# Patient Record
Sex: Female | Born: 1975 | Race: Black or African American | Hispanic: No | Marital: Single | Smoking: Former smoker
Health system: Southern US, Community
[De-identification: ages and names within clinical notes are randomized; demographics above are authoritative.]

## PROBLEM LIST (undated history)

## (undated) DIAGNOSIS — I10 Essential (primary) hypertension: Secondary | ICD-10-CM

---

## 2016-05-15 ENCOUNTER — Encounter: Payer: Self-pay | Admitting: Obstetrics and Gynecology

## 2018-01-22 ENCOUNTER — Emergency Department (HOSPITAL_COMMUNITY): Payer: Self-pay

## 2018-01-22 ENCOUNTER — Emergency Department (HOSPITAL_COMMUNITY)
Admission: EM | Admit: 2018-01-22 | Discharge: 2018-01-22 | Disposition: A | Discharge status: Home / Self Care | Payer: Self-pay | Attending: Emergency Medicine | Admitting: Emergency Medicine

## 2018-01-22 ENCOUNTER — Encounter (HOSPITAL_COMMUNITY): Payer: Self-pay | Admitting: Emergency Medicine

## 2018-01-22 DIAGNOSIS — R1032 Left lower quadrant pain: Secondary | ICD-10-CM | POA: Insufficient documentation

## 2018-01-22 DIAGNOSIS — F17210 Nicotine dependence, cigarettes, uncomplicated: Secondary | ICD-10-CM | POA: Insufficient documentation

## 2018-01-22 DIAGNOSIS — W19XXXA Unspecified fall, initial encounter: Secondary | ICD-10-CM | POA: Insufficient documentation

## 2018-01-22 DIAGNOSIS — M25562 Pain in left knee: Secondary | ICD-10-CM | POA: Insufficient documentation

## 2018-01-22 DIAGNOSIS — M545 Low back pain: Secondary | ICD-10-CM | POA: Insufficient documentation

## 2018-01-22 LAB — POC URINE PREG, ED: PREG TEST UR: NEGATIVE

## 2018-01-22 MED ORDER — METHOCARBAMOL 500 MG PO TABS: 500.0000 mg | ORAL_TABLET | Freq: Three times a day (TID) | ORAL | 0 refills | Status: AC | PRN

## 2018-01-22 MED ORDER — NAPROXEN 500 MG PO TABS: 500.0000 mg | ORAL_TABLET | Freq: Two times a day (BID) | ORAL | 0 refills | Status: AC

## 2018-01-22 NOTE — ED Provider Notes (Signed)
[facility]-EMERGENCY DEPT Provider Note   CSN: [id number] Arrival date & time: 01/22/18  0746     History   Chief Complaint Chief Complaint  Patient presents with  . Fall  . Knee Pain  . Back Pain  . Groin Pain    HPI Jeanette Ortiz is a 42 y.o. female with a hx of tobacco abuse who presents to the ED s/p mechanical fall yesterday afternoon with complaints of pain in the lower back, L groin, and L knee. Patient states she was ambulating on tile floor when she slipped on area of water on the ground. She states that she fell forward onto the L anterior knee and subsequently fell backwards with knee remaining in flexed position resulting in straining of L groin. She states she did hit the back of her head, but had no LOC. Able to get up and ambulate without assistance. She did have mild headache yesterday that resolved, not currently present. She has discomfort in the lower back, L groin, and L knee. Pain is a 5/10 in severity, worse with movement, mildly alleviated by motrin yesterday. Denies current headache, nausea, vomiting, confusion, seizure activity, neck pain, numbness, weakness, or paresthesias.   HPI  History reviewed. No pertinent past medical history.  There are no active problems to display for this patient.   History reviewed. No pertinent surgical history.   OB History   None      Home Medications    Prior to Admission medications   Not on File    Family History No family history on file.  Social History Social History   Tobacco Use  . Smoking status: Current Every Day Smoker    Types: Cigarettes  . Smokeless tobacco: Never Used  Substance Use Topics  . Alcohol use: Yes  . Drug use: Not on file     Allergies   Patient has no known allergies.   Review of Systems Review of Systems  Musculoskeletal: Positive for arthralgias (L knee/groin) and back pain. Negative for neck pain.  Neurological: Positive for headaches  (yesterday, resolved). Negative for dizziness, seizures, syncope, speech difficulty, weakness and numbness.  Psychiatric/Behavioral: Negative for confusion.     Physical Exam Updated Vital Signs BP (!) 159/102 (BP Location: Left Arm)   Pulse 85   Temp 98 F (36.7 C) (Oral)   Resp 18   Ht 5\' 6"  (1.676 m)   Wt 93 kg   LMP 01/12/2018   SpO2 100%   BMI 33.09 kg/m   Physical Exam  Constitutional: She appears well-developed and well-nourished.  Non-toxic appearance. No distress.  HENT:  Head: Normocephalic and atraumatic. Head is without raccoon's eyes and without Battle's sign.  Right Ear: No hemotympanum.  Left Ear: No hemotympanum.  Nose: No rhinorrhea.  Mouth/Throat: Uvula is midline and oropharynx is clear and moist.  Eyes: Pupils are equal, round, and reactive to light. Conjunctivae and EOM are normal. Right eye exhibits no discharge. Left eye exhibits no discharge.  Neck: Normal range of motion. No spinous process tenderness present. No edema and no erythema present.  Cardiovascular: Normal rate and regular rhythm.  No murmur heard. Pulses:      Dorsalis pedis pulses are 2+ on the right side, and 2+ on the left side.  Pulmonary/Chest: Effort normal and breath sounds normal. No respiratory distress. She has no wheezes. She has no rhonchi. She has no rales.  Abdominal: Soft. She exhibits no distension. There is no tenderness.  Musculoskeletal:  No obvious  deformity, appreciable swelling, erythema, ecchymosis, or open wounds.\ Upper extremities: Normal active range of motion of all joints.  Nontender to palpation. Back: Patient has diffuse lower lumbar tenderness without point/focal vertebral tenderness.  No step-off.  No tenderness to the cervical or thoracic spine. Lower extremities: Patient has full active range of motion to bilateral hips, knees, and ankles.  She has some discomfort with movement of the left hip and left knee.  She is tender to palpation along the anterior  left hip as well as the left lower extremity adductor muscle group.  Left knee is tender anteriorly including the patella, patella tendon, and tibial tuberosity.  Lower extremities are otherwise nontender.  Neurovascularly intact distally.   Skin: Skin is warm and dry. No rash noted.  Psychiatric: She has a normal mood and affect. Her behavior is normal.  Nursing note and vitals reviewed.    ED Treatments / Results  Labs (all labs ordered are listed, but only abnormal results are displayed) Labs Reviewed  POC URINE PREG, ED    EKG None  Radiology Dg Lumbar Spine Complete  Result Date: 01/22/2018 CLINICAL DATA:  Low back pain after fall yesterday. EXAM: LUMBAR SPINE - COMPLETE 4+ VIEW COMPARISON:  None. FINDINGS: There is no evidence of lumbar spine fracture. Alignment is normal. Intervertebral disc spaces are maintained. IMPRESSION: Normal lumbar spine. Electronically Signed   By: Lupita Raider, M.D.   On: 01/22/2018 09:22   Dg Knee Complete 4 Views Left  Result Date: 01/22/2018 CLINICAL DATA:  Left knee pain after fall yesterday. EXAM: LEFT KNEE - COMPLETE 4+ VIEW COMPARISON:  None. FINDINGS: No evidence of fracture, dislocation, or joint effusion. No evidence of arthropathy or other focal bone abnormality. Soft tissues are unremarkable. IMPRESSION: Normal left knee. Electronically Signed   By: Lupita Raider, M.D.   On: 01/22/2018 09:24   Dg Hip Unilat With Pelvis 2-3 Views Left  Result Date: 01/22/2018 CLINICAL DATA:  Left hip pain after fall yesterday. EXAM: DG HIP (WITH OR WITHOUT PELVIS) 2-3V LEFT COMPARISON:  None. FINDINGS: There is no evidence of hip fracture or dislocation. There is no evidence of arthropathy or other focal bone abnormality. IMPRESSION: Normal left hip. Electronically Signed   By: Lupita Raider, M.D.   On: 01/22/2018 09:23    Procedures Procedures (including critical care time)  Medications Ordered in ED Medications - No data to display   Initial  Impression / Assessment and Plan / ED Course  I have reviewed the triage vital signs and the nursing notes.  Pertinent labs & imaging results that were available during my care of the patient were reviewed by me and considered in my medical decision making (see chart for details).   Patient presents to the emergency department status post mechanical fall yesterday afternoon with complaints of L groin/knee pain and lower back pain.  Patient nontoxic-appearing, no apparent distress, vitals WNL with the exception of elevated blood pressure, doubt HTN emergency.  Per [place] head CT/cervical spine rules do not feel that further imaging is necessary in these locations.  X-rays obtained in areas of discomfort-negative for fracture or dislocation.  Patient without signs of serious head, neck, or back injury.  No point/focal vertebral tenderness.  She is neurovascularly intact distal to all areas of discomfort. Suspect muscle related soreness. Will discharge patient home with prescriptions for naproxen and robaxin, patient instructed not to drive/operate heavy machinery when taking robaxin. I discussed results, treatment plan, need for PCP follow-up, and  return precautions with the patient. Provided opportunity for questions, patient confirmed understanding and is in agreement with plan.    Final Clinical Impressions(s) / ED Diagnoses   Final diagnoses:  Fall, initial encounter    ED Discharge Orders         Ordered    naproxen (NAPROSYN) 500 MG tablet  2 times daily     01/22/18 0941    methocarbamol (ROBAXIN) 500 MG tablet  Every 8 hours PRN     01/22/18 0941           Anginette Espejo, Pleas Koch, PA-C 01/22/18 0957    Terrilee Files, MD 01/22/18 [phone]

## 2018-01-22 NOTE — Discharge Instructions (Addendum)
Please read and follow all provided instructions.  You have been seen today for pain to the left groin, knee, and lower back.  Tests performed today include: Xrays of affected areas do NOT show any broken bones or dislocations.  Vital signs. See below for your results today.   Home care:  Please apply ice wrapped in a towel 20 minutes on 40 minutes of to areas of discomfort for the next 24/48 hours.   Medications:  - Naproxen is a nonsteroidal anti-inflammatory medication that will help with pain and swelling. Be sure to take this medication as prescribed with food, 1 pill every 12 hours,  It should be taken with food, as it can cause stomach upset, and more seriously, stomach bleeding. Do not take other nonsteroidal anti-inflammatory medications with this such as Advil, Motrin, Aleve, Mobic, Goodie Powder, or Motrin.    - Robaxin is the muscle relaxer I have prescribed, this is meant to help with muscle tightness. Be aware that this medication may make you drowsy therefore the first time you take this it should be at a time you are in an environment where you can rest. Do not drive or operate heavy machinery when taking this medication. Do not drink alcohol or take other sedating medications with this medicine such as narcotics or benzodiazepines.   You make take Tylenol per over the counter dosing with these medications.   We have prescribed you new medication(s) today. Discuss the medications prescribed today with your pharmacist as they can have adverse effects and interactions with your other medicines including over the counter and prescribed medications. Seek medical evaluation if you start to experience new or abnormal symptoms after taking one of these medicines, seek care immediately if you start to experience difficulty breathing, feeling of your throat closing, facial swelling, or rash as these could be indications of a more serious allergic reaction   Follow-up instructions: Please  follow-up with your primary care provider if you continue to have significant pain in 1 week. In this case you may have a more severe injury that requires further care.   Return instructions:  Please return if your digits or extremity are numb or tingling, appear gray or blue, or you have severe pain  Please return if you experience vomiting, dizziness, loss of consciousness.  Please return to the Emergency Department if you experience worsening symptoms.  Please return if you have any other emergent concerns. Additional Information:  Your vital signs today were: BP (!) 159/102 (BP Location: Left Arm)    Pulse 85    Temp 98 F (36.7 C) (Oral)    Resp 18    Ht 5\' 6"  (1.676 m)    Wt 93 kg    LMP 01/12/2018    SpO2 100%    BMI 33.09 kg/m  If your blood pressure (BP) was elevated above 135/85 this visit, please have this repeated by your doctor within one month. ---------------

## 2018-01-22 NOTE — ED Triage Notes (Signed)
Pt reports slipping in water yesterday falling. C/o left knee, lower back pain and groin pain. reports hitting her head but denies LOC or taking blood thinners..Marland Kitchen

## 2019-09-08 IMAGING — CR DG KNEE COMPLETE 4+V*L*
4 series · 4 of 4 positions shown · non-contrast
Comparison: None.

CLINICAL DATA: Left knee pain after fall yesterday.

EXAM:
LEFT KNEE - COMPLETE 4+ VIEW

[t knee ap left]
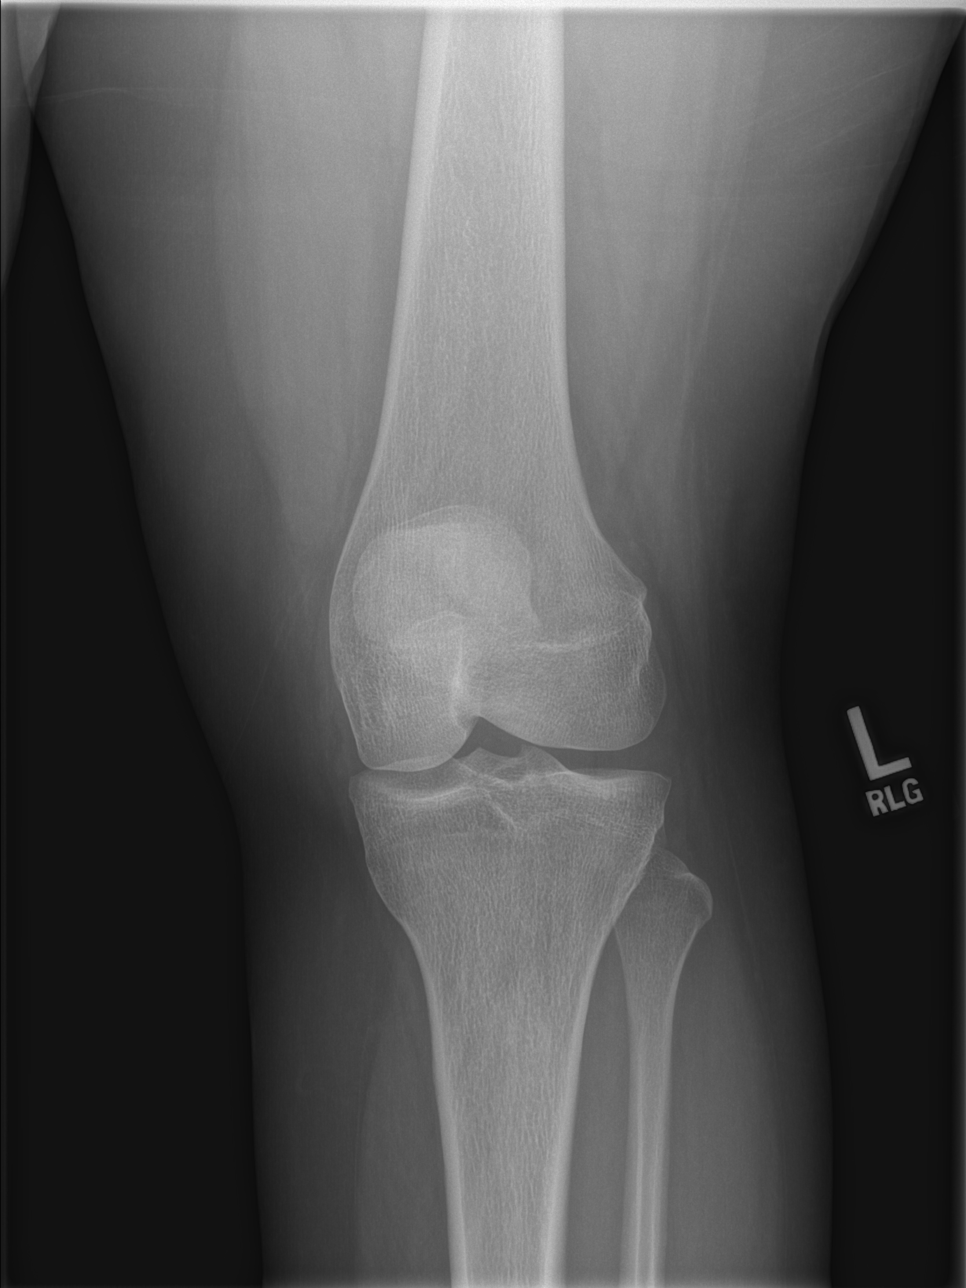

[t knee obl left (1 of 2)]
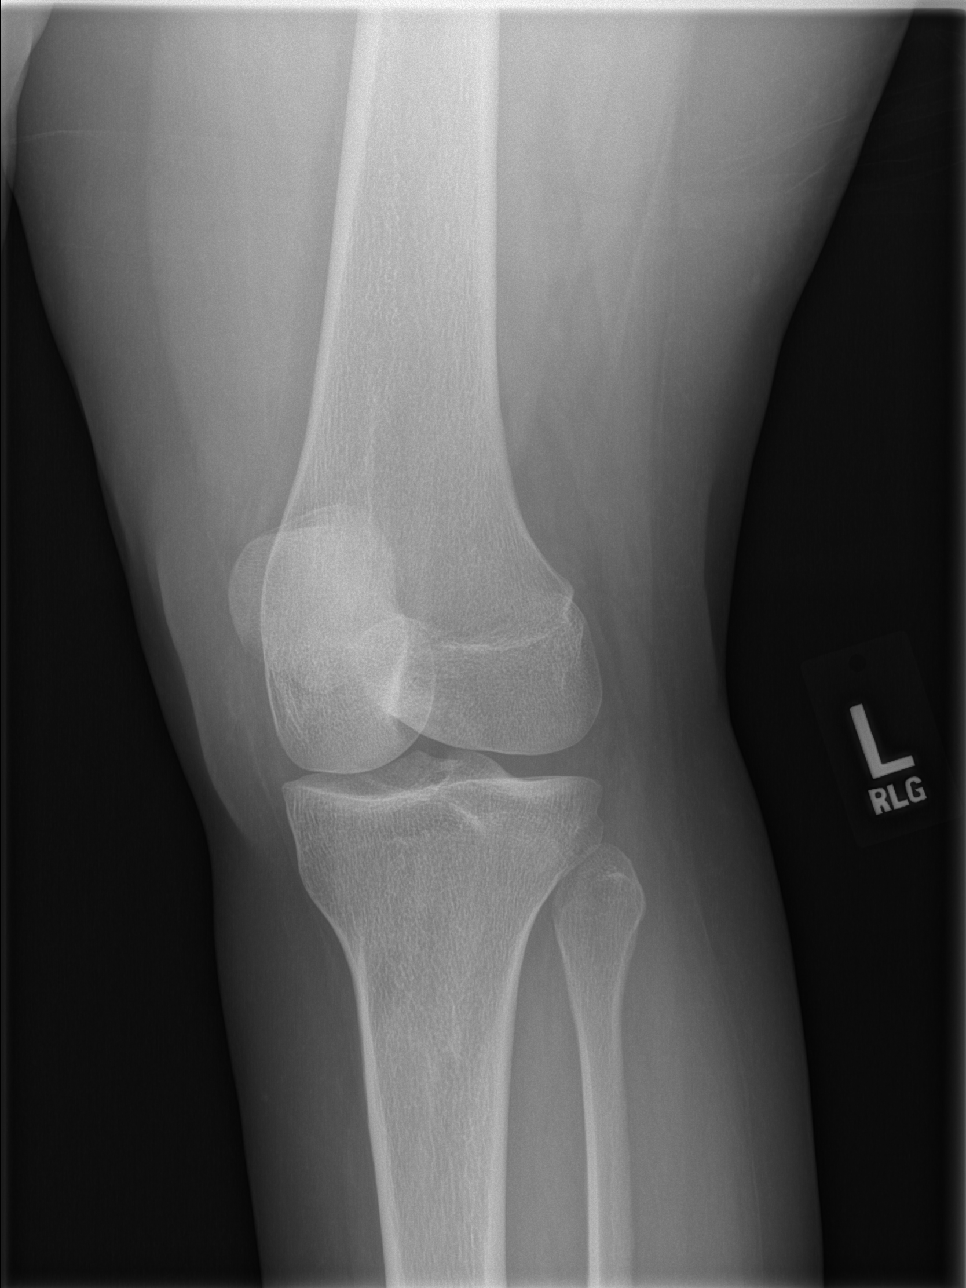

[t knee obl left (2 of 2)]
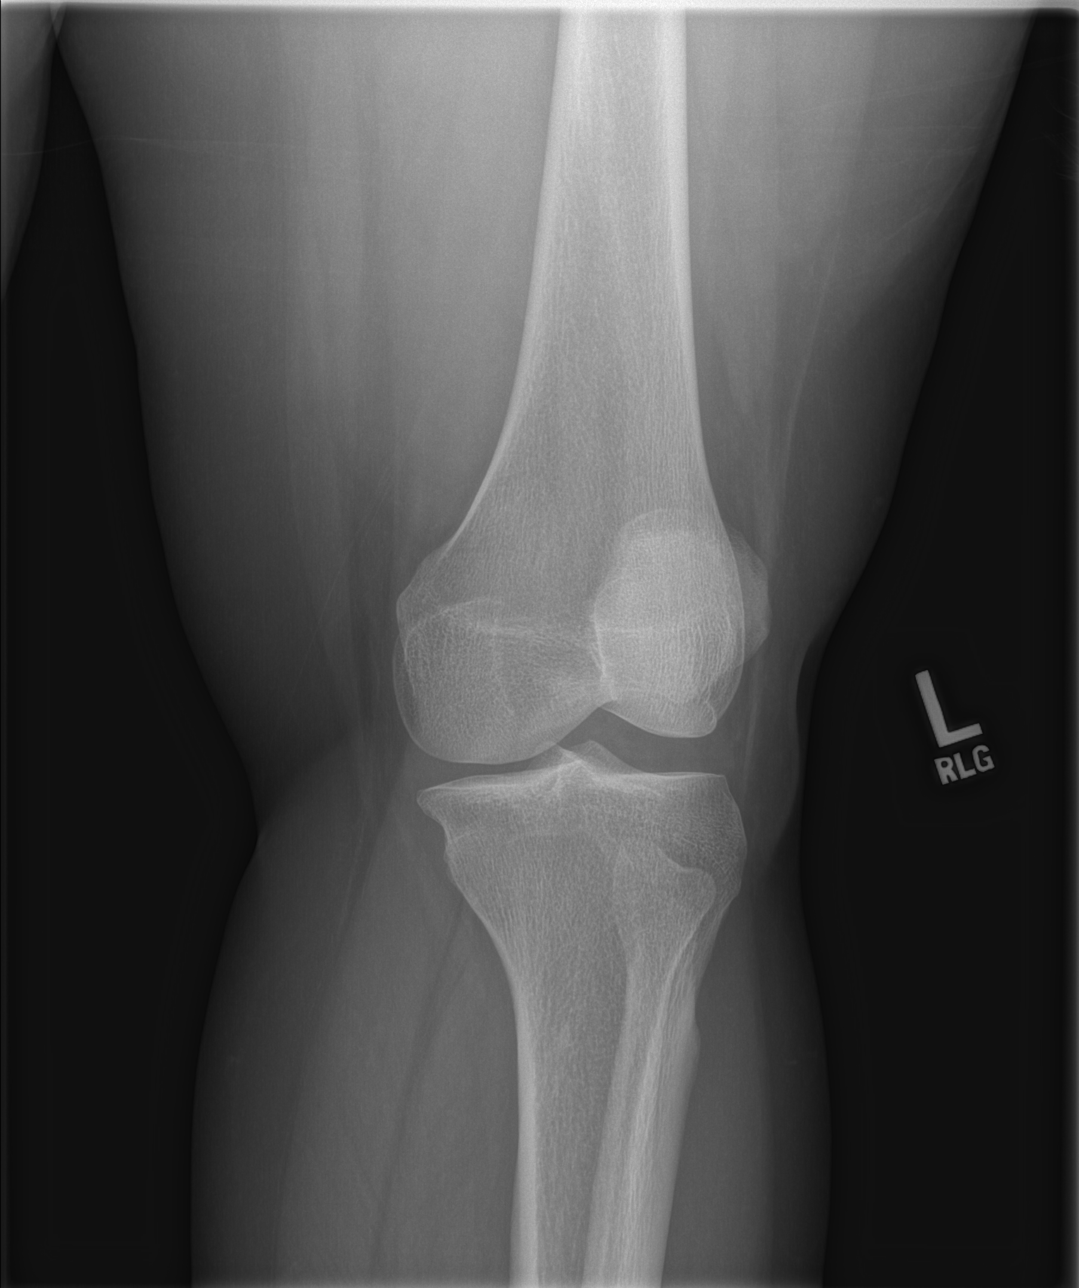

[t knee lat left]
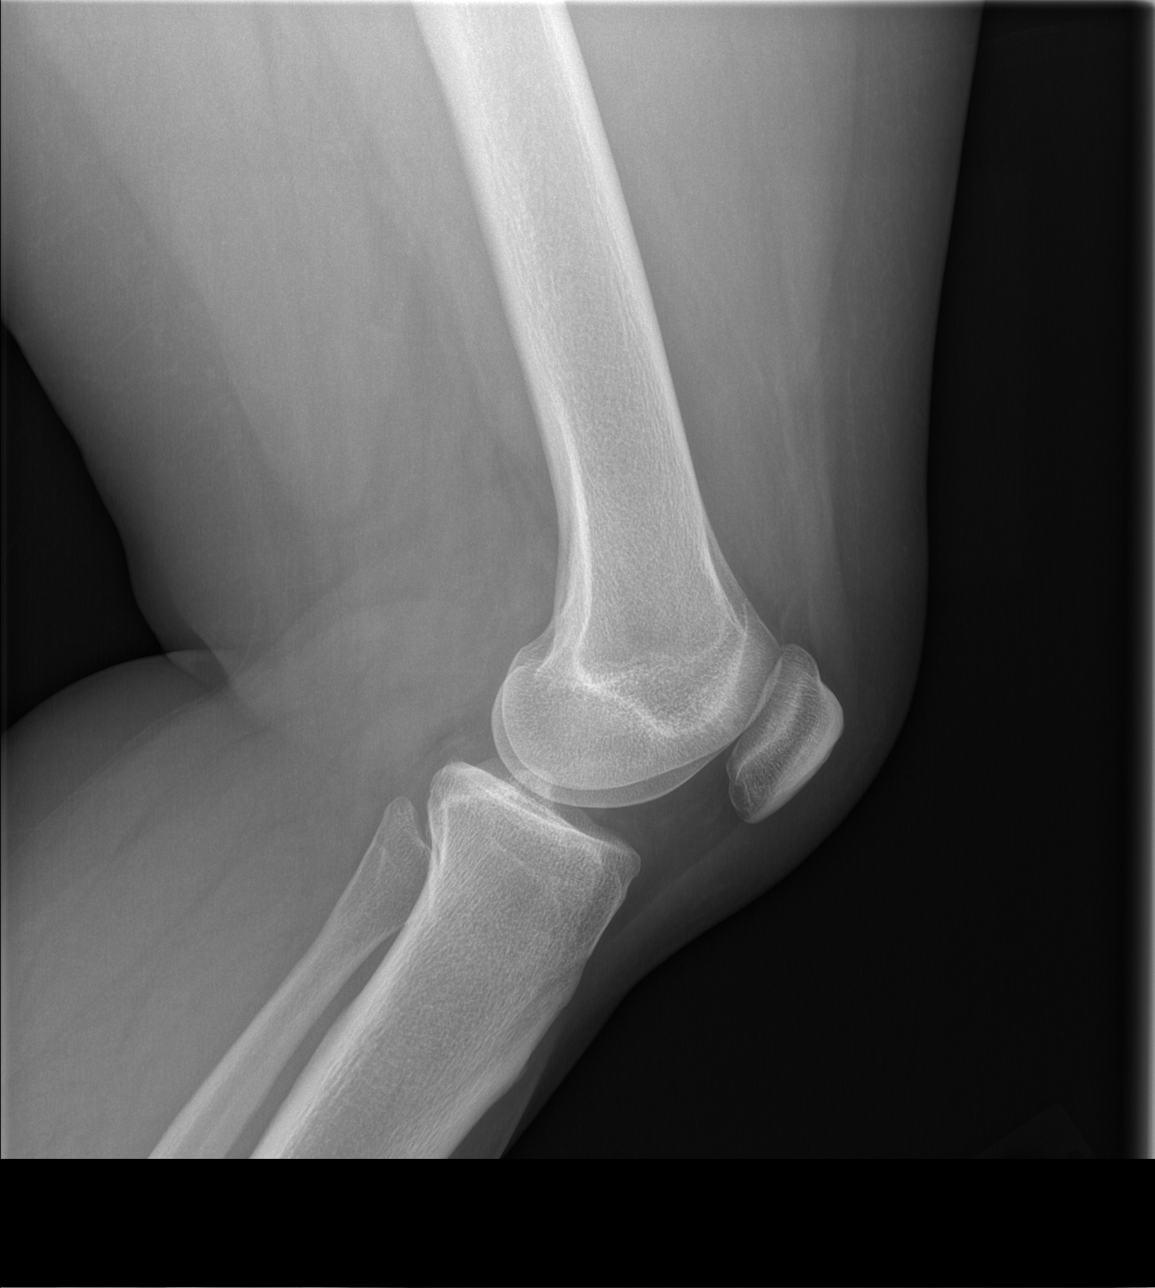

[4 of 4 positions shown; findings below may reference images not displayed]

FINDINGS: No evidence of fracture, dislocation, or joint effusion. No evidence
of arthropathy or other focal bone abnormality. Soft tissues are
unremarkable.
IMPRESSION: Normal left knee.

## 2022-04-07 ENCOUNTER — Ambulatory Visit: Payer: Self-pay

## 2022-05-12 ENCOUNTER — Ambulatory Visit
Admission: EM | Admit: 2022-05-12 | Discharge: 2022-05-12 | Disposition: A | Discharge status: Home / Self Care | Payer: BC Managed Care – PPO

## 2022-05-12 DIAGNOSIS — M25551 Pain in right hip: Secondary | ICD-10-CM

## 2022-05-12 DIAGNOSIS — M25552 Pain in left hip: Secondary | ICD-10-CM

## 2022-05-12 DIAGNOSIS — M545 Low back pain, unspecified: Secondary | ICD-10-CM

## 2022-05-12 HISTORY — DX: Essential (primary) hypertension: I10

## 2022-05-12 MED ORDER — PREDNISONE 20 MG PO TABS: ORAL_TABLET | ORAL | 0 refills | Status: AC

## 2022-05-12 MED ORDER — TIZANIDINE HCL 4 MG PO TABS: 4.0000 mg | ORAL_TABLET | Freq: Every day | ORAL | 0 refills | Status: AC

## 2022-05-12 MED ORDER — ACETAMINOPHEN 325 MG PO TABS: 650.0000 mg | ORAL_TABLET | Freq: Four times a day (QID) | ORAL | 0 refills | Status: AC | PRN

## 2022-05-12 NOTE — ED Triage Notes (Signed)
Pt c/o lower bacl, bilat hip and blat groin pain x 3 week-denies injury-states pain is affecting gait/shuffling-NAD

## 2022-05-12 NOTE — ED Provider Notes (Signed)
[facility] - U[facility]  Note:  This document was prepared using Systems analyst and may include unintentional dictation errors.  MRN: [id number] DOB: Oct 24, 1975  Subjective:   Jeanette Ortiz is a 47 y.o. female presenting for 3 week history of acute onset persistent bilateral hip and groin pain, low back pain/stiffness. Feels like she is buckling when she is walking or doing any kind of movement. Feels a lot of popping, clicking. Works as a Equities trader. Works long hours. No car accident, falls, trauma. Does not do heavy lifting, burdensome work outs on the hips and back.  Has a history of hypertension.  Takes her medications regularly for this.  Has not had to see an orthopedist.  No history of musculoskeletal disorders.  No current facility-administered medications for this encounter.  Current Outpatient Medications:    hydrochlorothiazide (HYDRODIURIL) 25 MG tablet, Take 25 mg by mouth daily., Disp: , Rfl:    valsartan (DIOVAN) 320 MG tablet, Take 320 mg by mouth daily., Disp: , Rfl:    ibuprofen (ADVIL,MOTRIN) 200 MG tablet, Take 400 mg by mouth every 6 (six) hours as needed for moderate pain., Disp: , Rfl:    methocarbamol (ROBAXIN) 500 MG tablet, Take 1 tablet (500 mg total) by mouth every 8 (eight) hours as needed for muscle spasms., Disp: 30 tablet, Rfl: 0   naproxen (NAPROSYN) 500 MG tablet, Take 1 tablet (500 mg total) by mouth 2 (two) times daily., Disp: 30 tablet, Rfl: 0   No Known Allergies  Past Medical History:  Diagnosis Date   Hypertension      History reviewed. No pertinent surgical history.  No family history on file.  Social History   Tobacco Use   Smoking status: Former    Types: Cigarettes   Smokeless tobacco: Never   Tobacco comments:    Quit 05/08/22  Vaping Use   Vaping Use: Never used  Substance Use Topics   Alcohol use: Yes    Comment: weekly   Drug use: Never    ROS   Objective:   Vitals: BP 129/87 (BP  Location: Left Arm)   Pulse 85   Temp 98.1 F (36.7 C) (Oral)   Resp 18   LMP 04/21/2022   SpO2 98%   Physical Exam Constitutional:      General: She is not in acute distress.    Appearance: Normal appearance. She is well-developed. She is not ill-appearing, toxic-appearing or diaphoretic.  HENT:     Head: Normocephalic and atraumatic.     Nose: Nose normal.     Mouth/Throat:     Mouth: Mucous membranes are moist.  Eyes:     General: No scleral icterus.       Right eye: No discharge.        Left eye: No discharge.     Extraocular Movements: Extraocular movements intact.  Cardiovascular:     Rate and Rhythm: Normal rate.  Pulmonary:     Effort: Pulmonary effort is normal.  Musculoskeletal:     Lumbar back: No swelling, edema, deformity, signs of trauma, lacerations, spasms, tenderness or bony tenderness. Normal range of motion. Negative right straight leg raise test and negative left straight leg raise test. No scoliosis.     Right hip: Tenderness and bony tenderness present. No deformity, lacerations or crepitus. Normal range of motion. Normal strength.     Left hip: Tenderness and bony tenderness present. No deformity, lacerations or crepitus. Normal range of motion. Normal strength.  Legs:  Skin:    General: Skin is warm and dry.  Neurological:     General: No focal deficit present.     Mental Status: She is alert and oriented to person, place, and time.     Motor: No weakness.     Coordination: Coordination normal.     Gait: Gait normal.     Deep Tendon Reflexes: Reflexes normal.  Psychiatric:        Mood and Affect: Mood normal.        Behavior: Behavior normal.        Thought Content: Thought content normal.        Judgment: Judgment normal.     Assessment and Plan :   PDMP not reviewed this encounter.  1. Hip pain, bilateral   2. Acute bilateral low back pain without sciatica     Previous imaging from 2019 was negative for any musculoskeletal  disorders.  I offered repeat x-rays x3 but patient declined for now.  Given the progression of her symptoms it is very recommended a oral prednisone course.  Couple this with Tylenol, muscle relaxant.  Follow-up with an orthopedic practice as soon as possible.  Provided her with information to this.  Advised that she return to clinic if she changes her mind about x-rays. Counseled patient on potential for adverse effects with medications prescribed/recommended today, ER and return-to-clinic precautions discussed, patient verbalized understanding.    Jaynee Eagles, [place] 05/12/22 [phone]

## 2022-05-13 ENCOUNTER — Ambulatory Visit (HOSPITAL_COMMUNITY): Payer: Self-pay

## 2022-05-13 ENCOUNTER — Ambulatory Visit: Payer: Self-pay

## 2023-06-07 ENCOUNTER — Ambulatory Visit
Admission: EM | Admit: 2023-06-07 | Discharge: 2023-06-07 | Disposition: A | Discharge status: Home / Self Care | Payer: BC Managed Care – PPO | Attending: Family Medicine | Admitting: Family Medicine

## 2023-06-07 DIAGNOSIS — Z0289 Encounter for other administrative examinations: Secondary | ICD-10-CM

## 2023-06-07 DIAGNOSIS — B349 Viral infection, unspecified: Secondary | ICD-10-CM

## 2023-06-07 NOTE — ED Triage Notes (Signed)
Pt presents to UC for a work note. Pt states earlier this week she had diarrhea, weakness, fatigue, loss of appetite. She took immodium which helped. Pt states she still feels a bit weak but diarrhea stopped 2 day ago.

## 2023-06-07 NOTE — Discharge Instructions (Addendum)
Follow-up with your PCP as needed

## 2023-06-07 NOTE — ED Provider Notes (Signed)
[facility]    CSN: [id number] Arrival date & time: 06/07/23  0801      History   Chief Complaint No chief complaint on file.   HPI Jeanette Ortiz is a 48 y.o. female presents for work excuse.  Patient reports earlier this week she had diarrhea, fatigue/weakness/loss of appetite.  States the symptoms have nearly resolved and she now needs a note to go back to work.  Denies any fevers, abdominal pain, hematochezia, body aches, chills, dysuria, nausea or vomiting.  No other concerns at this time.  HPI  Past Medical History:  Diagnosis Date   Hypertension     There are no active problems to display for this patient.   History reviewed. No pertinent surgical history.  OB History   No obstetric history on file.      Home Medications    Prior to Admission medications   Medication Sig Start Date End Date Taking? Authorizing Provider  hydrochlorothiazide (HYDRODIURIL) 25 MG tablet Take 25 mg by mouth daily.   Yes [provider]  rosuvastatin (CRESTOR) 10 MG tablet Take by mouth. 05/23/23 11/19/23 Yes [provider]  valsartan (DIOVAN) 320 MG tablet Take 320 mg by mouth daily.   Yes [provider]  acetaminophen (TYLENOL) 325 MG tablet Take 2 tablets (650 mg total) by mouth every 6 (six) hours as needed for moderate pain. 05/12/22   Wallis Bamberg, PA-C  ibuprofen (ADVIL,MOTRIN) 200 MG tablet Take 400 mg by mouth every 6 (six) hours as needed for moderate pain.    [provider]  methocarbamol (ROBAXIN) 500 MG tablet Take 1 tablet (500 mg total) by mouth every 8 (eight) hours as needed for muscle spasms. 01/22/18   Petrucelli, Samantha R, PA-C  naproxen (NAPROSYN) 500 MG tablet Take 1 tablet (500 mg total) by mouth 2 (two) times daily. 01/22/18   Petrucelli, Pleas Koch, PA-C  predniSONE (DELTASONE) 20 MG tablet Take 2 tablets daily with breakfast. 05/12/22   Wallis Bamberg, PA-C  tiZANidine (ZANAFLEX) 4 MG tablet Take 1 tablet (4 mg total) by  mouth at bedtime. 05/12/22   Wallis Bamberg, PA-C    Family History History reviewed. No pertinent family history.  Social History Social History   Tobacco Use   Smoking status: Every Day    Types: Cigarettes   Smokeless tobacco: Never   Tobacco comments:    Quit 05/08/22  Vaping Use   Vaping status: Never Used  Substance Use Topics   Alcohol use: Yes    Comment: weekly   Drug use: Never     Allergies   Patient has no known allergies.   Review of Systems Review of Systems  Constitutional:  Positive for fatigue.  Gastrointestinal:  Positive for diarrhea.     Physical Exam Triage Vital Signs ED Triage Vitals  Encounter Vitals Group     BP 06/07/23 0813 116/80     Systolic BP Percentile --      Diastolic BP Percentile --      Pulse Rate 06/07/23 0813 86     Resp 06/07/23 0813 16     Temp 06/07/23 0813 99.1 F (37.3 C)     Temp Source 06/07/23 0813 Oral     SpO2 06/07/23 0813 98 %     Weight --      Height --      Head Circumference --      Peak Flow --      Pain Score 06/07/23 0810 0  Pain Loc --      Pain Education --      Exclude from Growth Chart --    No data found.  Updated Vital Signs BP 116/80 (BP Location: Right Arm)   Pulse 86   Temp 99.1 F (37.3 C) (Oral)   Resp 16   SpO2 98%   Visual Acuity Right Eye Distance:   Left Eye Distance:   Bilateral Distance:    Right Eye Near:   Left Eye Near:    Bilateral Near:     Physical Exam Vitals and nursing note reviewed.  Constitutional:      General: She is not in acute distress.    Appearance: Normal appearance. She is not ill-appearing.  HENT:     Head: Normocephalic and atraumatic.  Eyes:     Pupils: Pupils are equal, round, and reactive to light.  Cardiovascular:     Rate and Rhythm: Normal rate.  Pulmonary:     Effort: Pulmonary effort is normal.  Abdominal:     General: Bowel sounds are normal.     Palpations: Abdomen is soft. There is no hepatomegaly or splenomegaly.      Tenderness: There is no abdominal tenderness.  Skin:    General: Skin is warm and dry.  Neurological:     General: No focal deficit present.     Mental Status: She is alert and oriented to person, place, and time.  Psychiatric:        Mood and Affect: Mood normal.        Behavior: Behavior normal.      UC Treatments / Results  Labs (all labs ordered are listed, but only abnormal results are displayed) Labs Reviewed - No data to display  EKG   Radiology No results found.  Procedures Procedures (including critical care time)  Medications Ordered in UC Medications - No data to display  Initial Impression / Assessment and Plan / UC Course  I have reviewed the triage vital signs and the nursing notes.  Pertinent labs & imaging results that were available during my care of the patient were reviewed by me and considered in my medical decision making (see chart for details).     Reviewed exam and symptoms with patient.  Patient requesting note for work and stating her symptoms have nearly resolved.  Vital signs stable and exam reassuring.  Work note provided.  Advised to follow-up with PCP as needed. Final Clinical Impressions(s) / UC Diagnoses   Final diagnoses:  Encounter to obtain excuse from work  Viral illness     Discharge Instructions      Follow-up with your PCP as needed    ED Prescriptions   None    PDMP not reviewed this encounter.   Radford Pax, NP 06/07/23 [phone]
